# Patient Record
Sex: Male | Born: 1990 | Race: Black or African American | Hispanic: No | Marital: Single | Smoking: Never smoker
Health system: Southern US, Community
[De-identification: ages and names within clinical notes are randomized; demographics above are authoritative.]

---
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
## 2010-09-28 ENCOUNTER — Emergency Department: Payer: Self-pay | Admitting: Emergency Medicine

## 2010-11-17 ENCOUNTER — Emergency Department: Payer: Self-pay | Admitting: Emergency Medicine

## 2012-01-15 IMAGING — CR DG CHEST 1V PORT
1 series · 1 of 1 positions shown · non-contrast
Comparison: none

REASON FOR EXAM: SYNCOPE
COMMENTS:

PROCEDURE:     DXR - DXR PORTABLE CHEST SINGLE VIEW  - September 28, 2010 [DATE]
RESULT:     The lung fields are clear. No pneumonia, pneumothorax or pleural
effusion is seen. The heart size is normal. Monitoring electrodes are
present.

[view not recorded]
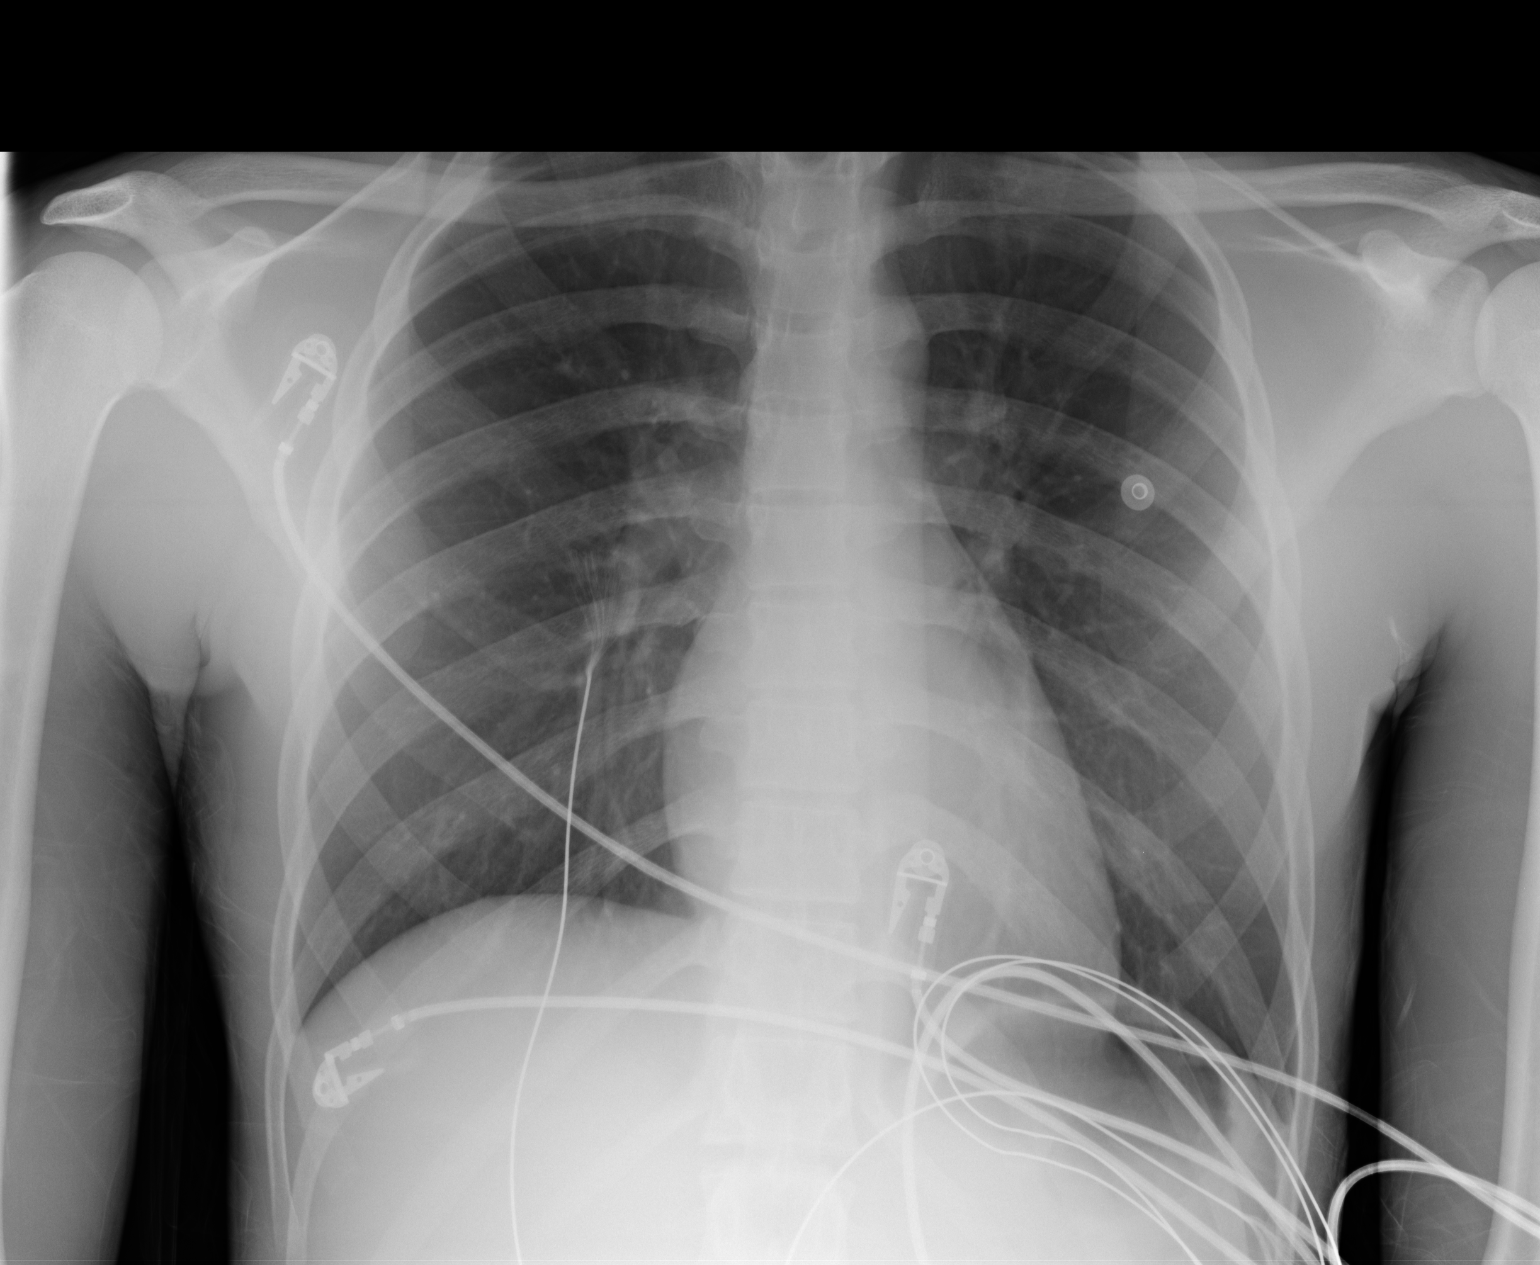

[1 of 1 positions shown; findings below may reference images not displayed]

IMPRESSION: No acute changes are identified.

## 2012-01-15 IMAGING — CT CT HEAD WITHOUT CONTRAST
2 of 4 series · 16 of 30 positions shown, 19 images · non-contrast
Comparison: none

REASON FOR EXAM: SYNCOPE AND HEAD INJURY
COMMENTS:

PROCEDURE:     CT  - CT HEAD WITHOUT CONTRAST  - September 28, 2010 [DATE]
RESULT:     Comparison:  None
TECHNIQUE: Multiple axial images from the foramen magnum to the vertex were
obtained without IV contrast.

[Series 2: without · axial · non-contrast · 0.44mm/px · z∈[-153,-38]mm · 10 of 29 slices shown, 13 images]
[im 3/29  brain]
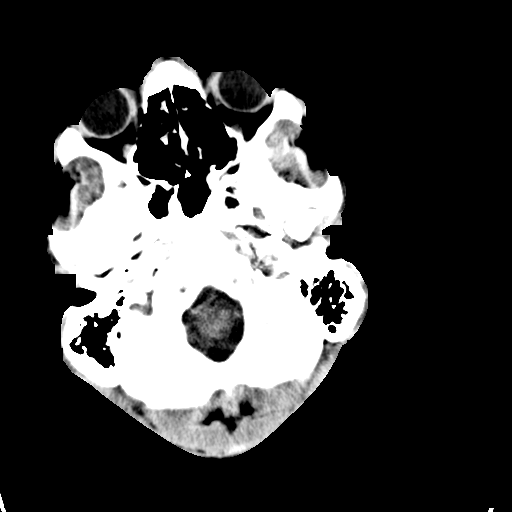
[im 3/29  bone]
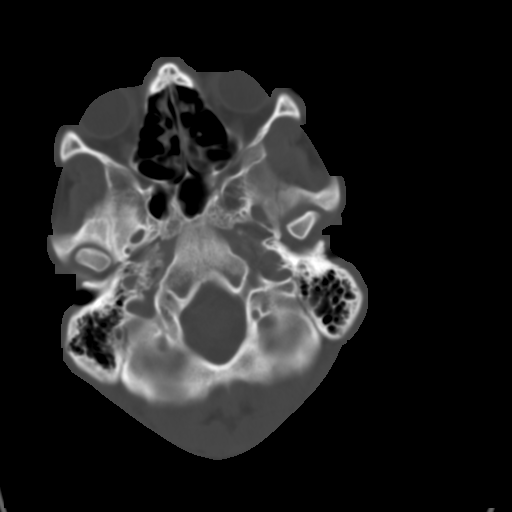
[im 6/29  brain]
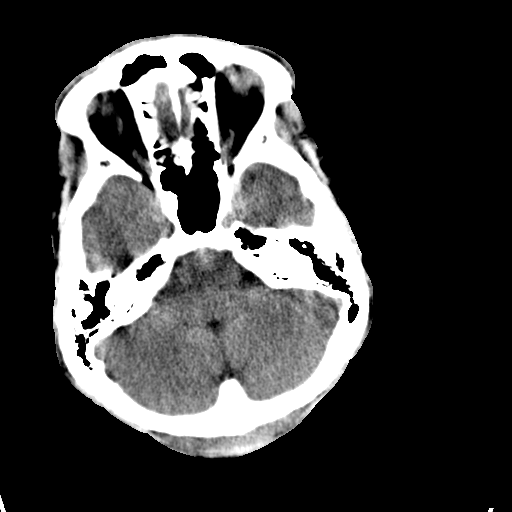
[im 8/29  brain]
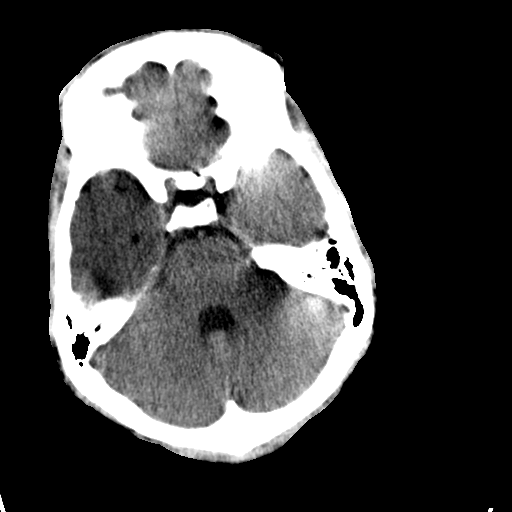
[im 11/29  brain]
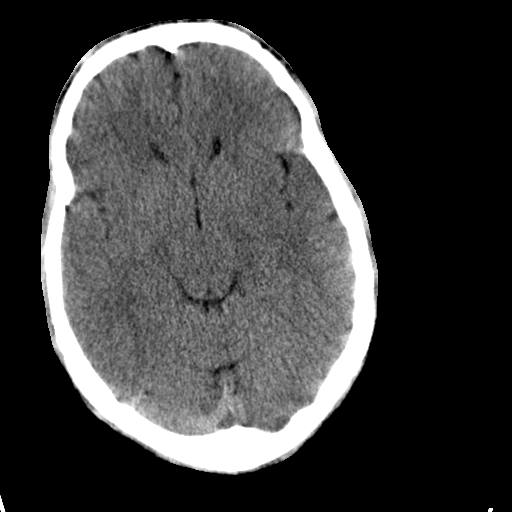
[im 13/29  brain]
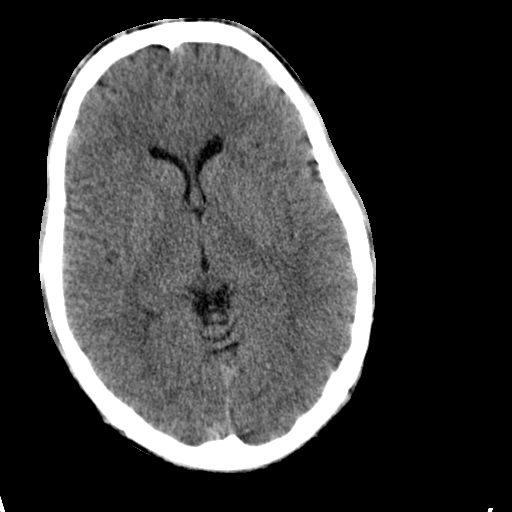
[im 13/29  bone]
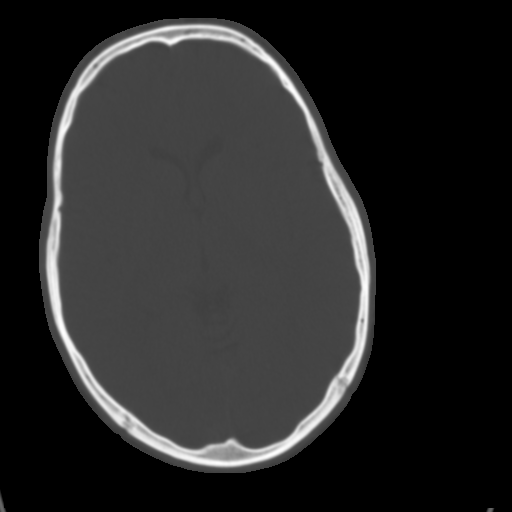
[im 16/29  brain]
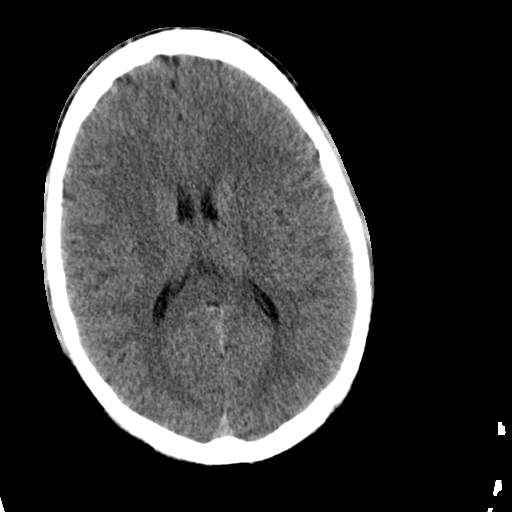
[im 18/29  brain]
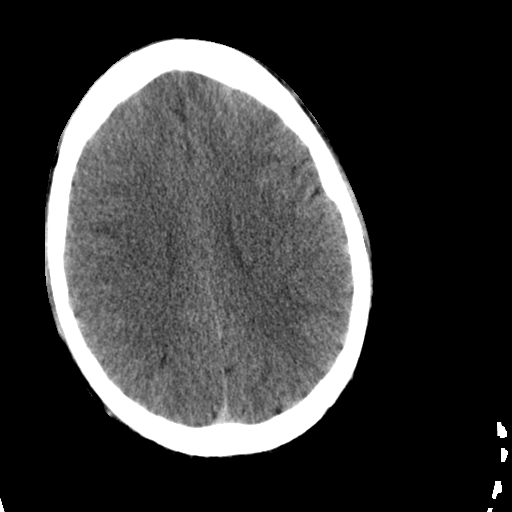
[im 21/29  brain]
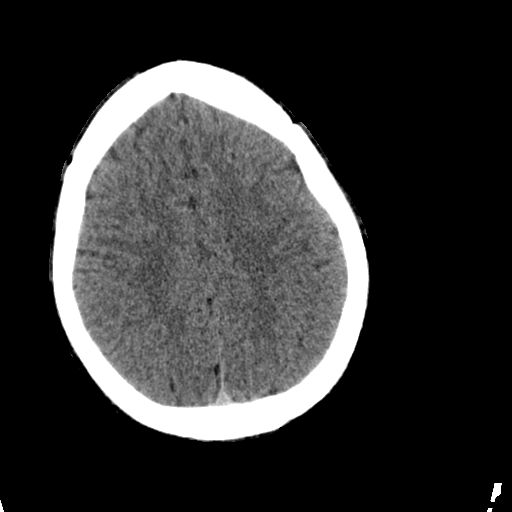
[im 23/29  brain]
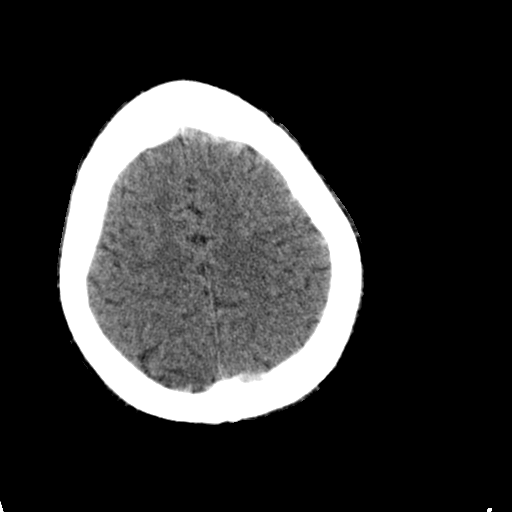
[im 23/29  bone]
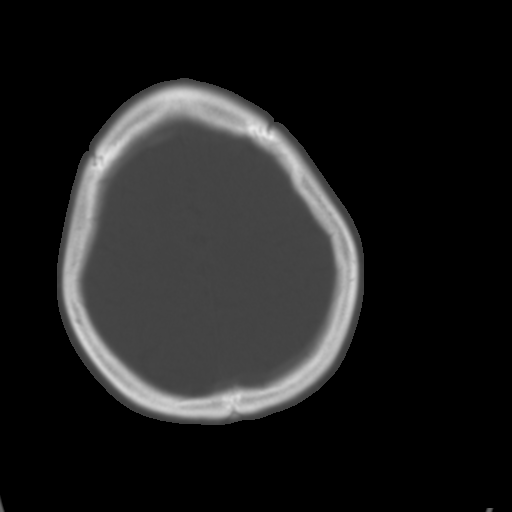
[im 26/29  brain]
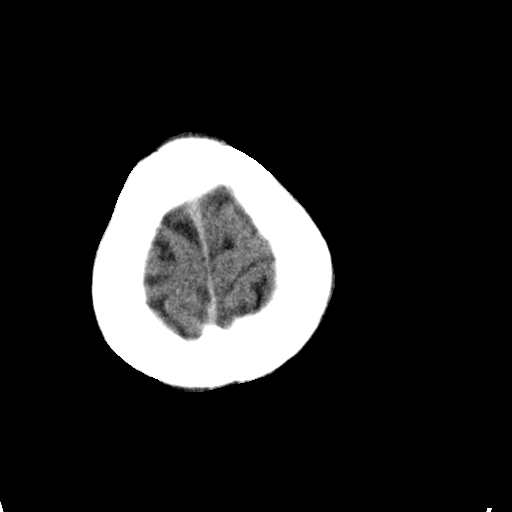

[Series 3: bone · axial · 0.44mm/px · z∈[-153,-78]mm · 6 of 29 slices shown]
[im 3/29  bone]
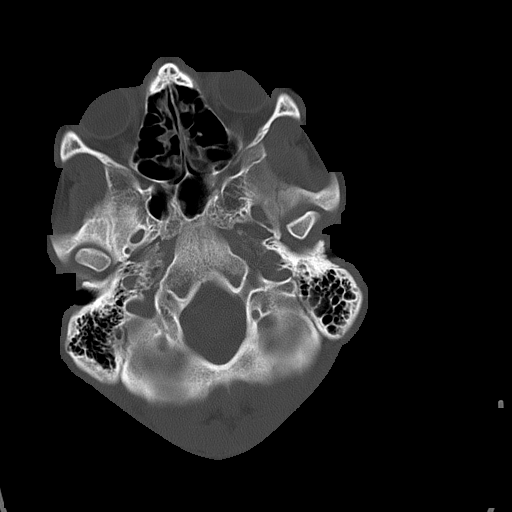
[im 6/29  bone]
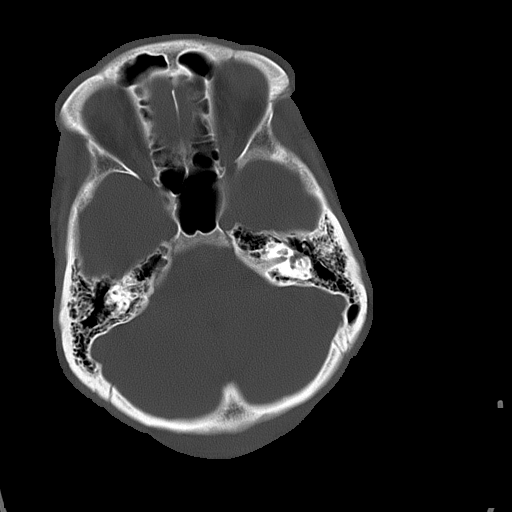
[im 11/29  bone]
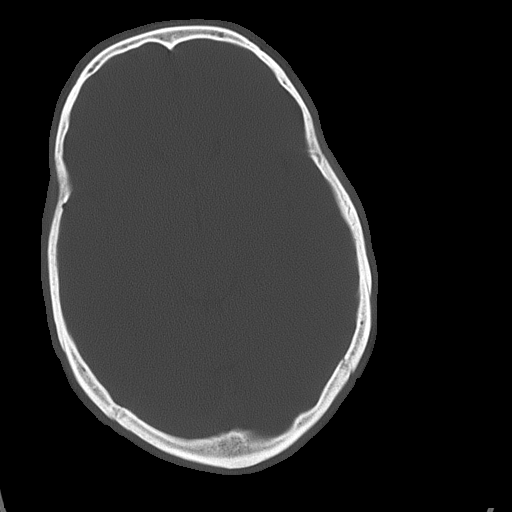
[im 13/29  bone]
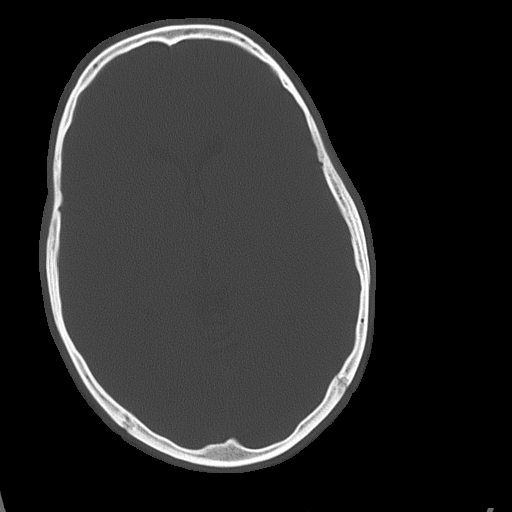
[im 16/29  bone]
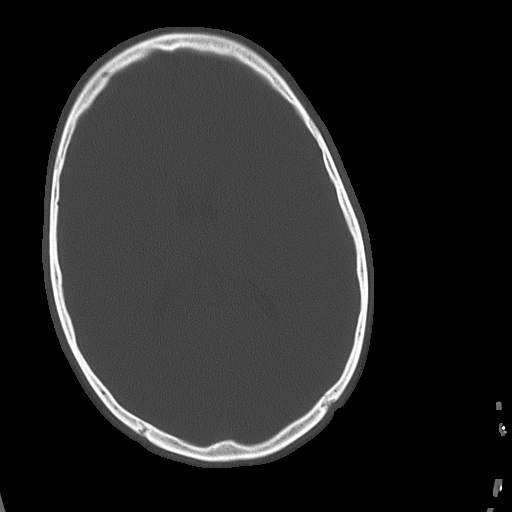
[im 18/29  bone]
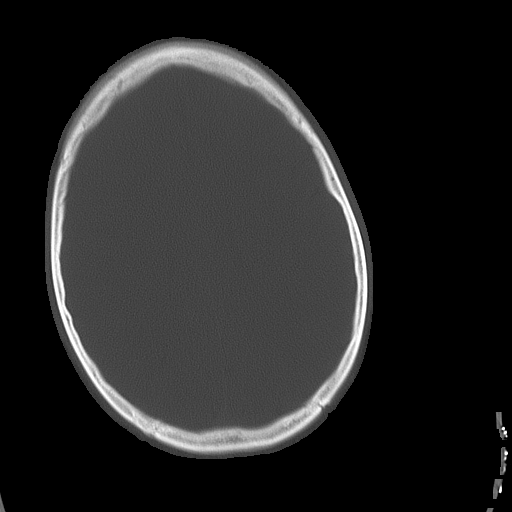

[16 of 30 positions shown; findings below may reference images not displayed]

FINDINGS: There is no evidence of mass effect, midline shift, or extra-axial fluid
collections.  There is no evidence of a space-occupying lesion or
intracranial hemorrhage. There is no evidence of a cortical-based area of
acute infarction.

The ventricles and sulci are appropriate for the patient's age. The basal
cisterns are patent.

Visualized portions of the orbits are unremarkable. The visualized portions
of the paranasal sinuses and mastoid air cells are unremarkable.

The osseous structures are unremarkable.
IMPRESSION: No acute intracranial process.

## 2012-03-11 ENCOUNTER — Encounter (HOSPITAL_COMMUNITY): Payer: Self-pay

## 2012-03-11 ENCOUNTER — Emergency Department (HOSPITAL_COMMUNITY): Payer: Self-pay

## 2012-03-11 ENCOUNTER — Emergency Department (HOSPITAL_COMMUNITY)
Admission: EM | Admit: 2012-03-11 | Discharge: 2012-03-11 | Disposition: A | Discharge status: Home / Self Care | Payer: Self-pay | Attending: Emergency Medicine | Admitting: Emergency Medicine

## 2012-03-11 DIAGNOSIS — R059 Cough, unspecified: Secondary | ICD-10-CM | POA: Insufficient documentation

## 2012-03-11 DIAGNOSIS — R110 Nausea: Secondary | ICD-10-CM | POA: Insufficient documentation

## 2012-03-11 DIAGNOSIS — R51 Headache: Secondary | ICD-10-CM | POA: Insufficient documentation

## 2012-03-11 DIAGNOSIS — R05 Cough: Secondary | ICD-10-CM | POA: Insufficient documentation

## 2012-03-11 DIAGNOSIS — B349 Viral infection, unspecified: Secondary | ICD-10-CM

## 2012-03-11 DIAGNOSIS — F17200 Nicotine dependence, unspecified, uncomplicated: Secondary | ICD-10-CM | POA: Insufficient documentation

## 2012-03-11 DIAGNOSIS — B9789 Other viral agents as the cause of diseases classified elsewhere: Secondary | ICD-10-CM | POA: Insufficient documentation

## 2012-03-11 DIAGNOSIS — R630 Anorexia: Secondary | ICD-10-CM | POA: Insufficient documentation

## 2012-03-11 DIAGNOSIS — J3489 Other specified disorders of nose and nasal sinuses: Secondary | ICD-10-CM | POA: Insufficient documentation

## 2012-03-11 DIAGNOSIS — R070 Pain in throat: Secondary | ICD-10-CM | POA: Insufficient documentation

## 2012-03-11 DIAGNOSIS — IMO0001 Reserved for inherently not codable concepts without codable children: Secondary | ICD-10-CM | POA: Insufficient documentation

## 2012-03-11 DIAGNOSIS — R5381 Other malaise: Secondary | ICD-10-CM | POA: Insufficient documentation

## 2012-03-11 DIAGNOSIS — R509 Fever, unspecified: Secondary | ICD-10-CM | POA: Insufficient documentation

## 2012-03-11 LAB — RAPID STREP SCREEN (MED CTR MEBANE ONLY): Streptococcus, Group A Screen (Direct): NEGATIVE

## 2012-03-11 MED ORDER — ACETAMINOPHEN 325 MG PO TABS
650.0000 mg | ORAL_TABLET | Freq: Once | ORAL | Status: AC
  Administered 2012-03-11: 650 mg via ORAL
  Filled 2012-03-11: qty 2

## 2012-03-11 NOTE — ED Provider Notes (Signed)
History     CSN: [id number]  Arrival date & time 03/11/12  1632   First MD Initiated Contact with Patient 03/11/12 1652      Chief Complaint  Patient presents with  . URI  . Sore Throat    x1week  . Fever    (Consider location/radiation/quality/duration/timing/severity/associated sxs/prior treatment) HPI Comments: Patient presents today with a chief complaint of fever, chills, fatigue, decreased appetite, productive cough, headache, sore throat, and body aches.  Symptoms have been present for the past 4 days.  He has not taken anything for his symptoms.   No sick contacts.  Patient is otherwise healthy.    Patient is a 21 y.o. male presenting with URI. The history is provided by the patient.  URI The primary symptoms include fever, fatigue, headaches, sore throat, cough, nausea and myalgias. Primary symptoms do not include ear pain, wheezing, abdominal pain, vomiting or rash. Episode onset: 4 days ago. The problem has been gradually worsening.  The headache is not associated with neck stiffness.  The sore throat is not accompanied by trouble swallowing or drooling.  Symptoms associated with the illness include chills and rhinorrhea.    History reviewed. No pertinent past medical history.  History reviewed. No pertinent past surgical history.  No family history on file.  History  Substance Use Topics  . Smoking status: Current Every Day Smoker  . Smokeless tobacco: Not on file  . Alcohol Use: No      Review of Systems  Constitutional: Positive for fever, chills, appetite change and fatigue.  HENT: Positive for sore throat and rhinorrhea. Negative for ear pain, drooling, trouble swallowing, neck stiffness and voice change.   Eyes: Negative for visual disturbance.  Respiratory: Positive for cough. Negative for shortness of breath and wheezing.   Gastrointestinal: Positive for nausea. Negative for vomiting and abdominal pain.  Musculoskeletal: Positive for myalgias.    Skin: Negative for rash.  Neurological: Positive for headaches. Negative for dizziness, syncope and light-headedness.  Psychiatric/Behavioral: Negative for confusion.    Allergies  Review of patient's allergies indicates no known allergies.  Home Medications  No current outpatient prescriptions on file.  BP 109/85  Pulse 110  Temp 102.5 F (39.2 C) (Oral)  Resp 18  SpO2 99%  Physical Exam  Nursing note and vitals reviewed. Constitutional: He appears well-developed and well-nourished.  Non-toxic appearance. No distress.  HENT:  Head: Normocephalic and atraumatic.  Right Ear: Tympanic membrane and ear canal normal.  Left Ear: Tympanic membrane and ear canal normal.  Nose: Nose normal.  Mouth/Throat: Uvula is midline and mucous membranes are normal. Oropharyngeal exudate, posterior oropharyngeal edema and posterior oropharyngeal erythema present. No tonsillar abscesses.  Neck: Normal range of motion. Neck supple.  Cardiovascular: Normal rate, regular rhythm and normal heart sounds.   Pulmonary/Chest: Effort normal and breath sounds normal. No respiratory distress. He has no wheezes. He has no rales.  Lymphadenopathy:    He has no cervical adenopathy.  Neurological: He is alert.  Skin: Skin is warm and dry. No rash noted. He is not diaphoretic.  Psychiatric: He has a normal mood and affect.    ED Course  Procedures (including critical care time)   Labs Reviewed  RAPID STREP SCREEN   Dg Chest 2 View  03/11/2012  *RADIOLOGY REPORT*  Clinical Data: Fever, chest pain, sore throat  CHEST - 2 VIEW  Comparison:  None.  Findings:  The heart size and mediastinal contours are within normal limits.  Both lungs are clear.  The visualized skeletal structures are unremarkable.  IMPRESSION: No active cardiopulmonary disease.   Original Report Authenticated By: Judie Petit. Ruel Favors, M.D.      No diagnosis found.    MDM  Patient presenting with fever, ST, cough, body aches, fatigue and  decreased appetite.  Negative CXR.  Negative rapid strep.  Suspect viral illness.  Patient discharged home.  Return precautions discussed.        Pascal Lux [place], PA-C 03/12/12 1629  Pascal Lux Hoyt, PA-C 03/12/12 1630

## 2012-03-14 NOTE — ED Provider Notes (Signed)
Medical screening examination/treatment/procedure(s) were performed by non-physician practitioner and as supervising physician I was immediately available for consultation/collaboration.   Malori Myers, MD 03/14/12 1534 

## 2013-07-22 ENCOUNTER — Emergency Department (HOSPITAL_COMMUNITY)
Admission: EM | Admit: 2013-07-22 | Discharge: 2013-07-23 | Disposition: A | Discharge status: Home / Self Care | Payer: Self-pay | Attending: Emergency Medicine | Admitting: Emergency Medicine

## 2013-07-22 ENCOUNTER — Encounter (HOSPITAL_COMMUNITY): Payer: Self-pay | Admitting: Emergency Medicine

## 2013-07-22 DIAGNOSIS — R634 Abnormal weight loss: Secondary | ICD-10-CM

## 2013-07-22 DIAGNOSIS — R61 Generalized hyperhidrosis: Secondary | ICD-10-CM | POA: Insufficient documentation

## 2013-07-22 DIAGNOSIS — L02212 Cutaneous abscess of back [any part, except buttock]: Secondary | ICD-10-CM

## 2013-07-22 DIAGNOSIS — R1031 Right lower quadrant pain: Secondary | ICD-10-CM | POA: Insufficient documentation

## 2013-07-22 DIAGNOSIS — R109 Unspecified abdominal pain: Secondary | ICD-10-CM

## 2013-07-22 DIAGNOSIS — L03319 Cellulitis of trunk, unspecified: Principal | ICD-10-CM

## 2013-07-22 DIAGNOSIS — R6883 Chills (without fever): Secondary | ICD-10-CM | POA: Insufficient documentation

## 2013-07-22 DIAGNOSIS — R591 Generalized enlarged lymph nodes: Secondary | ICD-10-CM

## 2013-07-22 DIAGNOSIS — R599 Enlarged lymph nodes, unspecified: Secondary | ICD-10-CM | POA: Insufficient documentation

## 2013-07-22 DIAGNOSIS — F17200 Nicotine dependence, unspecified, uncomplicated: Secondary | ICD-10-CM | POA: Insufficient documentation

## 2013-07-22 DIAGNOSIS — L02219 Cutaneous abscess of trunk, unspecified: Secondary | ICD-10-CM | POA: Insufficient documentation

## 2013-07-22 LAB — CBC WITH DIFFERENTIAL/PLATELET
Basophils Absolute: 0 10*3/uL (ref 0.0–0.1)
Basophils Relative: 0 % (ref 0–1)
Eosinophils Absolute: 0.6 10*3/uL (ref 0.0–0.7)
Eosinophils Relative: 5 % (ref 0–5)
HCT: 42.5 % (ref 39.0–52.0)
HEMOGLOBIN: 15.2 g/dL (ref 13.0–17.0)
Lymphocytes Relative: 24 % (ref 12–46)
Lymphs Abs: 2.8 10*3/uL (ref 0.7–4.0)
MCH: 32.4 pg (ref 26.0–34.0)
MCHC: 35.8 g/dL (ref 30.0–36.0)
MCV: 90.6 fL (ref 78.0–100.0)
MONO ABS: 1.2 10*3/uL — AB (ref 0.1–1.0)
MONOS PCT: 10 % (ref 3–12)
NEUTROS ABS: 7.2 10*3/uL (ref 1.7–7.7)
Neutrophils Relative %: 61 % (ref 43–77)
Platelets: 396 10*3/uL (ref 150–400)
RBC: 4.69 MIL/uL (ref 4.22–5.81)
RDW: 12.9 % (ref 11.5–15.5)
WBC: 11.8 10*3/uL — ABNORMAL HIGH (ref 4.0–10.5)

## 2013-07-22 LAB — COMPREHENSIVE METABOLIC PANEL
ALBUMIN: 4.5 g/dL (ref 3.5–5.2)
ALT: 8 U/L (ref 0–53)
AST: 15 U/L (ref 0–37)
Alkaline Phosphatase: 74 U/L (ref 39–117)
BILIRUBIN TOTAL: 0.5 mg/dL (ref 0.3–1.2)
BUN: 7 mg/dL (ref 6–23)
CALCIUM: 9.9 mg/dL (ref 8.4–10.5)
CHLORIDE: 97 meq/L (ref 96–112)
CO2: 28 mEq/L (ref 19–32)
CREATININE: 0.75 mg/dL (ref 0.50–1.35)
GFR calc Af Amer: >90 mL/min (ref >90)
Glucose, Bld: 83 mg/dL (ref 70–99)
Potassium: 3.8 mEq/L (ref 3.7–5.3)
Sodium: 137 mEq/L (ref 137–147)
Total Protein: 8.9 g/dL — ABNORMAL HIGH (ref 6.0–8.3)

## 2013-07-22 LAB — URINALYSIS, ROUTINE W REFLEX MICROSCOPIC
Bilirubin Urine: NEGATIVE
Glucose, UA: NEGATIVE mg/dL
Hgb urine dipstick: NEGATIVE
Ketones, ur: NEGATIVE mg/dL
Nitrite: NEGATIVE
Protein, ur: NEGATIVE mg/dL
Specific Gravity, Urine: 1.015 (ref 1.005–1.030)
Urobilinogen, UA: 4 mg/dL — ABNORMAL HIGH (ref 0.0–1.0)
pH: 8 (ref 5.0–8.0)

## 2013-07-22 LAB — URINE MICROSCOPIC-ADD ON

## 2013-07-22 MED ORDER — OXYCODONE-ACETAMINOPHEN 5-325 MG PO TABS: 1.0000 | ORAL_TABLET | ORAL | Status: AC | PRN

## 2013-07-22 MED ORDER — SULFAMETHOXAZOLE-TRIMETHOPRIM 800-160 MG PO TABS
1.0000 | ORAL_TABLET | Freq: Two times a day (BID) | ORAL | Status: AC
Start: 2013-07-22 — End: 2013-07-29

## 2013-07-22 MED ORDER — OXYCODONE-ACETAMINOPHEN 5-325 MG PO TABS
1.0000 | ORAL_TABLET | Freq: Once | ORAL | Status: AC
  Administered 2013-07-22: 1 via ORAL
  Filled 2013-07-22: qty 1

## 2013-07-22 MED ORDER — CEPHALEXIN 500 MG PO CAPS: 1000.0000 mg | ORAL_CAPSULE | Freq: Two times a day (BID) | ORAL | Status: AC

## 2013-07-22 NOTE — ED Notes (Signed)
Pt c/o pilonidal cyst x 3 days.

## 2013-07-22 NOTE — Discharge Instructions (Signed)
Take antibiotics as prescribed. Take percocet as needed for severe pain.  Do not drive within four hours of taking this medication (may cause drowsiness or confusion).   If the redness on your back spreads or you develop worsening abdominal pain, uncontrolled vomiting or fever, please return to ER.  Find a primary care doctor to evaluate you further for unexplained weight loss.    Abscess An abscess (boil or furuncle) is an infected area on or under the skin. This area is filled with yellowish-white fluid (pus) and other material (debris). HOME CARE   Only take medicines as told by your doctor.  If you were given antibiotic medicine, take it as directed. Finish the medicine even if you start to feel better.  If gauze is used, follow your doctor's directions for changing the gauze.  To avoid spreading the infection:  Keep your abscess covered with a bandage.  Wash your hands well.  Do not share personal care items, towels, or whirlpools with others.  Avoid skin contact with others.  Keep your skin and clothes clean around the abscess.  Keep all doctor visits as told. GET HELP RIGHT AWAY IF:   You have more pain, puffiness (swelling), or redness in the wound site.  You have more fluid or blood coming from the wound site.  You have muscle aches, chills, or you feel sick.  You have a fever. MAKE SURE YOU:   Understand these instructions.  Will watch your condition.  Will get help right away if you are not doing well or get worse. Document Released: 10/31/2007 Document Revised: 11/13/2011 Document Reviewed: 07/27/2011 [facility] Patient Information 2014 [place], [place].   Emergency Department Resource Guide 1) Find a Doctor and Pay Out of Pocket Although you won't have to find out who is covered by your insurance plan, it is a good idea to ask around and get recommendations. You will then need to call the office and see if the doctor you have chosen will accept you as a new  patient and what types of options they offer for patients who are self-pay. Some doctors offer discounts or will set up payment plans for their patients who do not have insurance, but you will need to ask so you aren't surprised when you get to your appointment.  2) Contact Your Local Health Department Not all health departments have doctors that can see patients for sick visits, but many do, so it is worth a call to see if yours does. If you don't know where your local health department is, you can check in your phone book. The CDC also has a tool to help you locate your state's health department, and many state websites also have listings of all of their local health departments.  3) Find a Walk-in Clinic If your illness is not likely to be very severe or complicated, you may want to try a walk in clinic. These are popping up all over the country in pharmacies, drugstores, and shopping centers. They're usually staffed by nurse practitioners or physician assistants that have been trained to treat common illnesses and complaints. They're usually fairly quick and inexpensive. However, if you have serious medical issues or chronic medical problems, these are probably not your best option.  No Primary Care Doctor: - Call Health Connect at  [phone] - they can help you locate a primary care doctor that  accepts your insurance, provides certain services, etc. - Physician Referral Service- [phone]  Chronic Pain Problems: Organization  Address  Phone   Notes  [facility]  [phone] Patients need to be referred by their primary care doctor.   Medication Assistance: Organization         Address  Phone   Notes  [facility] Medication [facility] [address] [place], [place] [address] [phone] --Must be a resident of [facility] -- Must have NO insurance coverage whatsoever (no Medicaid/ Medicare, etc.) -- The pt. MUST have a primary  care doctor that directs their care regularly and follows them in the community   MedAssist  [phone]   [employer]  [phone]    Agencies that provide inexpensive medical care: Organization         Address  Phone   Notes  [place]  [phone]   [facility]    [phone]   [facility] [place], [place] [address] [phone]   [place] [address] [place] [phone]   Planned Parenthood    [phone]   [facility]    [phone]   [place] and [place] [address], [place] Phone:  [phone], Fax:  [phone] Hours of Operation:  9 am - 6 pm, M-F.  Also accepts Medicaid/Medicare and self-pay.  [facility] for [place], [address], [place] Phone: [phone], Fax: [phone]. Hours of Operation:  8:30 am - 5:30 pm, M-F.  Also accepts Medicaid and self-pay.  [facility] [place] [address] [place] Phone: [phone]   [place], [place], [place] [phone], Ext. [phone] Mondays & Thursdays: 7-9 AM.  First 15 patients are seen on a first come, first serve basis.    [place] Providers:  Organization         Address  Phone   Notes  [facility] [address], [place] [phone] Also accepts self-pay patients.  [facility] [address], [place]  [phone]   [place], [address], [place] [phone]   [facility] [address] [place] [phone]   Lucianne Lei [address], [place]   [phone] Only accepts [place] patients after they have their name applied to their card.   Self-Pay (no insurance) in [facility]:  Organization         Address  Phone   Notes  Sickle Cell Patients, [facility] [place] [phone]   [facility] [place] [phone]   [facility] [place]  [place], [place], [place] [phone]   [facility]/Dr. Osei-Bonsu  [address] [place] or [address], [place] [phone] Phone number for both [place] and [place] locations is the same.  Urgent Medical and [facility] [place] [address], [place] [phone]   [facility] [address] [place] or [address] [phone]   [facility] [address] [place] [phone], phone; [phone], fax Sees patients 1st and 3rd Saturday of every month.  Must not qualify  for public or private insurance (i.e. Medicaid, Medicare, [place] Health Choice, Veterans' Benefits)  Household income should be no more than 200% of the poverty level The clinic cannot treat you if you are pregnant or think you are pregnant  Sexually transmitted diseases are not treated at the clinic.    Dental Care: Organization         Address  Phone  Notes  [facility] Department of [facility] [phone] Accepts children up to age 28 who are enrolled in [place] or [place]; pregnant women with a Medicaid card; and children who have applied for Medicaid or [place] Health Choice, but were declined, whose parents can pay a reduced fee at time of service.  [facility] Department of [facility]  [address], [place] [phone] Accepts children up to age 68 who are enrolled in [place] or [place]; pregnant women with a Medicaid card; and children who have applied for Medicaid or  Health Choice, but were declined, whose parents can  pay a reduced fee at time of service.  [facility]  [place] [phone] Patients are seen by appointment only. Walk-ins are not accepted. [place] will see patients 52 years of age and older. Monday - Tuesday (8am-5pm) Most Wednesdays (8:30-5pm) $30 per visit, cash only  [facility] Adult Dental Access PRO[facility]  [address], [facility] [phone] Patients are seen by appointment only. Walk-ins are not accepted. [place] will see patients 36 years of age and older. One Wednesday Evening (Monthly: Volunteer Based).  $30 per visit, cash only  [place]  [phone] for adults; Children under age 40, call Graduate Pediatric Dentistry at [phone]. Children aged 22-14, please call [phone] to request a pediatric application.  Dental services are provided in all areas of dental care including fillings, crowns and bridges, complete and partial dentures, implants, gum treatment, root canals, and extractions. Preventive care is also provided. Treatment is provided to both adults and children. Patients are selected via a lottery and there is often a waiting list.   [facility] [address] [place]  [phone] [email or web address]   [facility] [address] [place], [place] [phone], Ext. [phone] Second and Fourth Thursday of each month, opens at 6:30 AM; Clinic ends at 9 AM.  Patients are seen on a first-come first-served basis, and a limited number are seen during each clinic.   [facility]  [address] Hillard Danker [place], [place] [phone]   Eligibility Requirements You must have lived in [place], [place], or [place] counties for at least the last three months.   You cannot be eligible for state or federal sponsored [employer], including [employer], [place], or [employer].   You generally cannot be eligible for healthcare  insurance through your employer.    How to apply: Eligibility screenings are held every Tuesday and Wednesday afternoon from 1:00 pm until 4:00 pm. You do not need an appointment for the interview!  [facility] [address] [place], [place]   [place] Department  [place]  [phone]    Behavioral Health Resources in the Community: Intensive Outpatient Programs Organization         Address  Phone  Notes  °[facility] [address], [place], [place] [phone]   °[facility] [address], [place], [place] [phone]   °[facility] [address], [place], [place] ° 336-882-2125   °[facility] [address],  °[place], [place] 1-800-853-5163 or 336-641-4981   °Substance Abuse Resources °Organization         Address  Phone  Notes  °[facility]  336-882-2125   °[facility]  336-784-9470   °[facility]  336-285-9073   °[facility]  336-845-3988   °Residential & Outpatient Substance Abuse Program  1-800-659-3381   °Psychological Services °Organization         Address  Phone  Notes  °[facility]  336- [phone]   °[facility]  336- 378-7881   °[facility] [address], [place] 1-800-853-5163 or 336-641-4981   ° °Mobile Crisis Teams °Organization         Address  Phone  Notes  °[facility], Mobile Crisis Care Unit  1-877-626-1772   °Assertive °[facility]otherapeutic Services ° [address] [place], [place] 336-834-9664   °Sharon DeEsch [address] °[place] 336-554-5454   ° °Self-Help/Support Groups °Organization         Address  Phone             Notes  °[facility] [place] - variety of support groups  336- 373-1402 Call for more information  °Narcotics Anonymous (NA),  [facility] [address], °[place]  2 meetings at this location  ° °Residential Treatment Programs °Organization         Address  Phone  Notes  °[facility] [address],    °[place]  1-866-801-8205   °[facility] ° [address], [place], [place] [phone]   °[facility] [address], [place] 336-845-3988 Admissions: 8am-3pm M-F  °[facility] [address]    °[place], [place] 336-841-1104   °[facility] [address], [place], [place] 336-379-7146   °[facility] [address]  °[place], [place] 336-285-9073   °[facility] - Intensive Outpatient [address], [place], [place] 336-852-3033   °[facility] [address]  °[place], [place] 1-877-615-2722 or 336-784-9470   °[facility] ([facility]) [address] [place], [place] 336-227-7417 Accepts Medicaid  °[facility] [address]  ° [place] 1-800-659-3381 Substance Abuse/Addiction Treatment  ° °[place] Behavioral Health Resources °Organization         Address  Phone  Notes  °[facility]  (888) 581-9988   °Julie Brannon, PhD [address] [place], [place]   (336) 349-5553 or (336) 951-0000   ° Behavioral   [address] °[place], [place] (336) 349-4454   °[facility] [address], [place], [place] (336) 342-8316 Insurance/Medicaid/sponsorship through [facility]  °[facility] [address]                                    [place],  (336) [phone] Therapy/tele-psych/case  °[facility] [address]  ° ,  (336) 349-2233    °Dr. Arfeen  (336) 349-4544   °[facility] [place]  [employer] [place]   [facility] 1) [address],  2) [address], [place] 3)  [address], [place] [phone]) [phone]) [phone]  [phone](336) [phone]    [facility] Child Abuse Hotline 650-302-2686(336) 251-763-6226 or 6013421486(336) [phone] (After Hours)
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
## 2013-07-22 NOTE — ED Notes (Signed)
Pt has an abscess to lower R side of back. Pt has also states he may have one forming to R lower groin area.

## 2013-07-23 NOTE — ED Provider Notes (Signed)
Medical screening examination/treatment/procedure(s) were performed by non-physician practitioner and as supervising physician I was immediately available for consultation/collaboration.  EKG Interpretation   None         Shanna CiscoMegan E Zakariye Nee, MD 07/23/13 1622

## 2013-07-24 LAB — GC/CHLAMYDIA PROBE AMP
CT Probe RNA: POSITIVE — AB
GC PROBE AMP APTIMA: NEGATIVE

## 2013-07-24 LAB — URINE CULTURE: Colony Count: 15000

## 2013-07-25 NOTE — ED Notes (Signed)
Chart sent to EDP office for review.+ Chlamydia 

## 2013-07-26 ENCOUNTER — Telehealth (HOSPITAL_COMMUNITY): Payer: Self-pay | Admitting: Emergency Medicine

## 2013-07-26 MED ORDER — DOXYCYCLINE HYCLATE 100 MG PO CAPS: 100.0000 mg | ORAL_CAPSULE | Freq: Two times a day (BID) | ORAL | Status: AC

## 2013-07-26 NOTE — ED Notes (Signed)
Chart returned from EDP office. Per Wentz MD, give Doxycycline 100 mg PO BID x 7 days. 

## 2013-07-26 NOTE — ED Provider Notes (Signed)
I was routed laboratory followup for this patient's visit on 07/23/2013. Labs were reviewed. RNA positive for Chlamydia, rest, negative. I have written a prescription for doxycycline. I will ask ED staff to get it to him. He needs to be instructed that he has an STD and that all partners should be treated and he should refrain from sexual intercourse, for a period of 10 days. At that time, he should followup for a test of cure with the physician of his choice.  Flint MelterElliott L Reginaldo Hazard, MD 07/26/13 [phone]

## 2013-08-01 ENCOUNTER — Telehealth (HOSPITAL_BASED_OUTPATIENT_CLINIC_OR_DEPARTMENT_OTHER): Payer: Self-pay | Admitting: Emergency Medicine

## 2013-08-01 ENCOUNTER — Telehealth (HOSPITAL_COMMUNITY): Payer: Self-pay | Admitting: *Deleted

## 2013-08-01 NOTE — ED Notes (Signed)
Pt called back to get results.  + for Chlamydia, given instructions, called in Rx for Doxycycline 100mg   Capsules, Take 1 Capsule PO BID x7 days, #14, no refills. Prescriber MD Effie ShyWentz, called into [facility] at [phone] per pt's request. -

## 2017-10-23 ENCOUNTER — Emergency Department (HOSPITAL_COMMUNITY): Admission: EM | Admit: 2017-10-23 | Discharge: 2017-10-23 | Discharge status: Against Medical Advice | Payer: Self-pay

## 2017-10-23 NOTE — ED Notes (Signed)
Pt called to be triaged x2 with no response.  RN notified. 

## 2017-10-23 NOTE — ED Notes (Signed)
Pt called to be triaged x3 with no response.  RN notified.

## 2018-08-28 ENCOUNTER — Emergency Department (HOSPITAL_COMMUNITY)
Admission: EM | Admit: 2018-08-28 | Discharge: 2018-09-26 | Disposition: E | Payer: Medicaid Other | Attending: Emergency Medicine | Admitting: Emergency Medicine

## 2018-08-28 ENCOUNTER — Encounter (HOSPITAL_COMMUNITY): Payer: Self-pay | Admitting: Emergency Medicine

## 2018-08-28 DIAGNOSIS — Y939 Activity, unspecified: Secondary | ICD-10-CM | POA: Insufficient documentation

## 2018-08-28 DIAGNOSIS — I469 Cardiac arrest, cause unspecified: Secondary | ICD-10-CM | POA: Diagnosis not present

## 2018-08-28 DIAGNOSIS — Y999 Unspecified external cause status: Secondary | ICD-10-CM | POA: Diagnosis not present

## 2018-08-28 DIAGNOSIS — S21101A Unspecified open wound of right front wall of thorax without penetration into thoracic cavity, initial encounter: Secondary | ICD-10-CM | POA: Diagnosis present

## 2018-08-28 DIAGNOSIS — S21139A Puncture wound without foreign body of unspecified front wall of thorax without penetration into thoracic cavity, initial encounter: Secondary | ICD-10-CM

## 2018-08-28 DIAGNOSIS — S41032A Puncture wound without foreign body of left shoulder, initial encounter: Secondary | ICD-10-CM | POA: Diagnosis not present

## 2018-08-28 DIAGNOSIS — W3400XA Accidental discharge from unspecified firearms or gun, initial encounter: Secondary | ICD-10-CM | POA: Diagnosis not present

## 2018-08-28 DIAGNOSIS — S21131A Puncture wound without foreign body of right front wall of thorax without penetration into thoracic cavity, initial encounter: Secondary | ICD-10-CM | POA: Diagnosis not present

## 2018-08-28 DIAGNOSIS — Y929 Unspecified place or not applicable: Secondary | ICD-10-CM | POA: Insufficient documentation

## 2018-08-28 LAB — PREPARE FRESH FROZEN PLASMA
Unit division: 0
Unit division: 0

## 2018-08-28 LAB — BPAM FFP
Blood Product Expiration Date: 202004022359
Blood Product Expiration Date: 202004052359
ISSUE DATE / TIME: 202004021912
ISSUE DATE / TIME: 202004021912
Unit Type and Rh: 6200
Unit Type and Rh: 6200

## 2018-08-28 LAB — BPAM RBC
Blood Product Expiration Date: 202004222359
Blood Product Expiration Date: 202004222359
ISSUE DATE / TIME: 202004021912
ISSUE DATE / TIME: 202004021912
Unit Type and Rh: 5100
Unit Type and Rh: 5100

## 2018-08-28 MED ORDER — EPINEPHRINE PF 1 MG/10ML IJ SOSY
PREFILLED_SYRINGE | INTRAMUSCULAR | Status: AC | PRN
  Administered 2018-08-28: 1 via INTRAVENOUS

## 2018-08-29 ENCOUNTER — Encounter (HOSPITAL_COMMUNITY): Payer: Self-pay | Admitting: Emergency Medicine

## 2018-09-26 NOTE — ED Notes (Signed)
Diona Fanti - Brother [phone]

## 2018-09-26 NOTE — ED Notes (Signed)
Eye prep complete  
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
## 2018-09-26 NOTE — ED Notes (Signed)
Trauma @ bedside preparing to insert chest tube R side
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
## 2018-09-26 NOTE — ED Notes (Signed)
Time of Death 10

## 2018-09-26 NOTE — ED Notes (Signed)
All clothing/shoes kept with pt, 78$ cash given to [employer]
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
## 2018-09-26 NOTE — ED Notes (Signed)
Resident @ bedside preparing to insert chest tube L side
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
## 2018-09-26 DEATH — deceased
